# Patient Record
Sex: Male | Born: 1985 | Race: White | Hispanic: No | Marital: Single | Smoking: Former smoker
Health system: Southern US, Community
[De-identification: ages and names within clinical notes are randomized; demographics above are authoritative.]

## PROBLEM LIST (undated history)

## (undated) DIAGNOSIS — I471 Supraventricular tachycardia, unspecified: Secondary | ICD-10-CM

## (undated) HISTORY — DX: Supraventricular tachycardia, unspecified: I47.10

## (undated) HISTORY — DX: Supraventricular tachycardia: I47.1

---

## 2007-01-07 ENCOUNTER — Ambulatory Visit: Payer: Self-pay | Admitting: Internal Medicine

## 2007-01-08 ENCOUNTER — Ambulatory Visit (HOSPITAL_COMMUNITY): Admission: RE | Admit: 2007-01-08 | Discharge: 2007-01-08 | Payer: Self-pay | Admitting: Internal Medicine

## 2007-01-09 ENCOUNTER — Ambulatory Visit: Payer: Self-pay | Admitting: Cardiology

## 2007-03-26 ENCOUNTER — Ambulatory Visit: Payer: Self-pay | Admitting: Internal Medicine

## 2007-09-11 ENCOUNTER — Ambulatory Visit: Payer: Self-pay | Admitting: Internal Medicine

## 2008-03-31 ENCOUNTER — Ambulatory Visit: Payer: Self-pay | Admitting: Internal Medicine

## 2009-08-17 ENCOUNTER — Ambulatory Visit (HOSPITAL_COMMUNITY): Admission: EM | Admit: 2009-08-17 | Discharge: 2009-08-17 | Payer: Self-pay | Admitting: Emergency Medicine

## 2009-08-17 ENCOUNTER — Encounter (INDEPENDENT_AMBULATORY_CARE_PROVIDER_SITE_OTHER): Payer: Self-pay | Admitting: Surgery

## 2010-06-13 LAB — COMPREHENSIVE METABOLIC PANEL
ALT: 27 U/L (ref 0–53)
Albumin: 4.4 g/dL (ref 3.5–5.2)
Alkaline Phosphatase: 77 U/L (ref 39–117)
CO2: 26 mEq/L (ref 19–32)
Calcium: 9.4 mg/dL (ref 8.4–10.5)
GFR calc Af Amer: >60 mL/min (ref >60)
GFR calc non Af Amer: >60 mL/min (ref >60)
Total Bilirubin: 1.2 mg/dL (ref 0.3–1.2)

## 2010-06-13 LAB — CBC
HCT: 45.2 % (ref 39.0–52.0)
Hemoglobin: 15.8 g/dL (ref 13.0–17.0)
MCHC: 35 g/dL (ref 30.0–36.0)
Platelets: 138 10*3/uL — ABNORMAL LOW (ref 150–400)
RBC: 5.03 MIL/uL (ref 4.22–5.81)
WBC: 14 10*3/uL — ABNORMAL HIGH (ref 4.0–10.5)

## 2010-06-13 LAB — POCT CARDIAC MARKERS
CKMB, poc: 2.4 ng/mL (ref 1.0–8.0)
Myoglobin, poc: 117 ng/mL (ref 12–200)
Troponin i, poc: <0.05 ng/mL (ref 0.00–0.09)

## 2010-06-13 LAB — DIFFERENTIAL
Eosinophils Absolute: 0.3 10*3/uL (ref 0.0–0.7)
Lymphocytes Relative: 6 % — ABNORMAL LOW (ref 12–46)
Lymphs Abs: 0.9 10*3/uL (ref 0.7–4.0)
Neutrophils Relative %: 86 % — ABNORMAL HIGH (ref 43–77)

## 2010-06-13 LAB — URINALYSIS, ROUTINE W REFLEX MICROSCOPIC
Bilirubin Urine: NEGATIVE
Hgb urine dipstick: NEGATIVE
Ketones, ur: NEGATIVE mg/dL
Protein, ur: NEGATIVE mg/dL
Specific Gravity, Urine: 1.026 (ref 1.005–1.030)
Urobilinogen, UA: 0.2 mg/dL (ref 0.0–1.0)

## 2010-06-13 LAB — LIPASE, BLOOD: Lipase: 27 U/L (ref 11–59)

## 2010-08-09 NOTE — Assessment & Plan Note (Signed)
[facility]                         ELECTROPHYSIOLOGY OFFICE NOTE   NAME:Gary Salazar                 MRN:          [id number]  DATE:01/07/2007                            DOB:          [birth year]    I had the privilege of seeing Gary Salazar today at the family's  request because of supraventricular tachycardia.   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  Most  of these spells have been of relative short duration, less than 10-15  minutes.  He had an episode in June and another one in September, both  of which required him to call EMS (see below) and for which he received  adenosine which was quite symptomatic.   In any case, with his symptoms of tachycardia, he gets quite short of  breath.  He gets lightheaded.  He has a heaviness in his chest.  These  are fraud negative and diuretic negative.  In the past, he has tried  carotid massage and Valsalva to terminate these spells that has been  without effect.   He went on these occasions to [facility].  Electrocardiograms are described below.   He is aware of some change in his exercise capacity during the summer.  He thinks his heart is weaker but he is not quite sure whether this is  in fact real or whether this just represents an increased awareness of  his heartbeat and of occasional skips.   He has no other limiting symptoms.  He has no significant shortness of  breath, chest pain, and there has been no syncope.   FAMILY HISTORY:  Negative.   He has tried to cut down on his caffeine use and other stimulant use as  well as his alcohol use.   He takes no medications.   HE IS INTOLERANT OF ZITHROMAX.   SOCIAL HISTORY:  Is as noted previously, he does use alcohol and  occasional cigarettes.   PRIOR SURGERIES:  Hernia repair.   EXAMINATION:  He is a  healthy-appearing young Caucasian male looking his  age of 89.  His blood pressure is 118/70.  His heart rate was 57.  HEENT:  No icterus or xanthoma.  The neck veins were flat.  The carotids were brisk and full bilaterally  without bruits.  The back was without kyphosis, scoliosis.  LUNGS:  Clear.  Heart sounds were regular without murmurs, rubs, or gallops.  ABDOMEN:  Soft with active bowel sounds without midline pulsation, or  hepatomegaly.  Femoral pulses were 2+ distal.  Pulses were intact, and there is no  clubbing, cyanosis, or edema.  NEUROLOGIC:  Grossly normal.  SKIN:  Warm and dry.   Electrocardiogram was tachycardic done the 7th of September demonstrated  a narrow QRS tachycardia at a cycle length of 290 msec.  There was an R  prime in lead V1.  Sinus electrocardiogram dated that same day  demonstrated a rate of 104 with intervals of 0.17/0.9/0.30.  The axis  was 55 degrees, and no R prime was noted; however, on the  electrocardiogram dated today at a rate of 57, there is an R prime again  in lead V1.  There is also suggestion of a 2nd deflection in the  proximal portion of the ST segment and tachycardia.   IMPRESSION:  1. 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  2. Question heart weakness following these episodes above.   Gary Salazar and his mother and I had a lengthy discussion regarding the  physiology and the treatment options.  We discussed the potential role  of p.r.n. beta blockers or calcium blockers, daily therapy, and/or EP  study with catheter ablation.  We discussed the potential benefits as  well as the potential risks including but not limited to death,  perforation, heart block requiring pacemaker implantation.  They  understand these risks.   After these discussions, we have elected to proceed with a 2 D  echocardiogram and p.r.n. calcium blocker  therapy.   In addition, I have suggested that when he encounters EMS that he ask  them to use either intravenous diltiazem or intravenous verapamil given  these significant symptoms associated with his adenosine.   We will plan to see him again over Christmas vacation.     Duke Salvia, MD, [facility]  Electronically Signed    SCK/MedQ  DD: 01/07/2007  DT: 01/07/2007  Job #: [id number]   cc:   Donna Bernard, M.D.  [place] [address] [address], [phone], [address],

## 2010-08-09 NOTE — Assessment & Plan Note (Signed)
 [facility]ALTH[facility]                         ELECTROPHYSIOLOGY OFFICE NOTE   NAME:Salazar, Gary VALLEE                 MRN:          [id number]  DATE:03/31/2008                            DOB:          1985-09-08    Mr. Gary Salazar is seen in followup for SVT, probably AV reentry.  He has had  no recurrent palpitations.  He is still taking his weight lifting  supplements but without difficulty.   His medications include only the supplements.   PHYSICAL EXAMINATION:  VITAL SIGNS:  Today, his blood pressure is 104/64  and his weight was 222 which is down 10 pounds.  LUNGS:  Clear.  HEART:  Sounds were regular.  EXTREMITIES:  Without edema.   Electrocardiogram demonstrated sinus rhythm at 66 with __________.  The  axis was 75 degrees.  __________primarily in V1.   IMPRESSION:  1. Supraventricular tachycardia.  2. Regular sinus rhythm prime.   Mr. Gary Salazar is stable.  We will see him again in 1 year's time.     Duke Salvia, MD, [facility]  Electronically Signed    SCK/MedQ  DD: 03/31/2008  DT: 04/01/2008  Job #: [id number]   cc:   Donna Bernard, M.D.

## 2010-08-09 NOTE — Assessment & Plan Note (Signed)
[facility]                         ELECTROPHYSIOLOGY OFFICE NOTE   NAME:Dzikowski, ANIAS BARTOL                 MRN:          [id number]  DATE:03/26/2007                            DOB:          08/31/1985    Hunter Hohensee is back from school.  He has had no intercurrent episodes  of SVT.  He was given a prescription for verapamil which was the wrong  prescription and will give him a new one today.   Specifically he was given verapamil 240 to use p.r.n.   EXAMINATION:  On examination today his blood pressure is 129/69.  His  pulse was 66.  His weight was 227.  LUNGS:  Clear.  HEART:  Sounds were regular.   IMPRESSION:  1. Supraventricular tachycardia, probably AV re-entry.  2. Obesity.   Durene Cal is doing quite well.  Will plan to see him again in 6 month's  time and I have given a prescription for verapamil 40 mg to take as  needed.     Duke Salvia, MD, [facility]  Electronically Signed    SCK/MedQ  DD: 03/26/2007  DT: 03/26/2007  Job #: [id number]   cc:   Simone Curia, MD

## 2010-08-09 NOTE — Assessment & Plan Note (Signed)
[facility]                         ELECTROPHYSIOLOGY OFFICE NOTE   NAME:Wnek, DIEGO ULBRICHT                 MRN:          [id number]  DATE:09/11/2007                            DOB:          [birth year]    HISTORY:  Mr. Durene Cal is seen in followup for supraventricular  tachycardia, which is probably AV reentry.  He has had no recurrent  tachy palpitations.  He has had occasional isolated extra beats where he  feels like his heart is stopping.  This occurred in the context of  having broken up with his girlfriend of 4 years.   PHYSICAL EXAMINATION:  GENERAL:  He is in no acute distress.  VITAL SIGNS:  His blood pressure is 114/63.  LUNGS:  His lungs are clear.  HEART:  Heart sounds are regular.  EXTREMITIES:  Were without edema.   IMPRESSION:  1. Supraventricular tachycardia probably atrioventricular reentry.  2. Palpitations consistent with premature ventricle contractions.   PLAN:  Mr. Groeneveld is stable.  We will plan to see him again in 6 months'  time.     Duke Salvia, MD, [facility]  Electronically Signed    SCK/MedQ  DD: 09/11/2007  DT: 09/12/2007  Job #: [id number]   cc:   Donna Bernard, M.D.

## 2010-08-12 NOTE — Procedures (Signed)
NAME:  ROGERIO, BOUTELLE               ACCOUNT NO.:  [id number]   MEDICAL RECORD NO.:  [id number]          PATIENT TYPE:  OUT   LOCATION:  RAD                           FACILITY:  [facility]   PHYSICIAN:  Gerrit Friends. Dietrich Pates, MD, FACCDATE OF BIRTH:  1985/09/04   DATE OF PROCEDURE:  01/09/2007  DATE OF DISCHARGE:                                ECHOCARDIOGRAM   CLINICAL DATA:  A 25 year old gentleman with PSVT.   M-MODE:  Aorta 2.9, left atrium 4.07, LV diastole 4.2, LV systole 3.3.   FINDINGS:  1. Technically adequate echocardiographic study.  2. Normal left atrium, right atrium and right ventricle.  3. Normal and trileaflet aortic valve.  4. Normal ascending aorta and aortic arch.  5. Normal mitral, tricuspid and pulmonic valve; normal proximal      pulmonary artery; physiologic tricuspid regurgitation with normal      estimated RV systolic pressure.  6. Normal internal dimension, wall thickness, regional and global      function of the left ventricle.  7. Normal IVC.      Gerrit Friends. Dietrich Pates, MD, [facility]  Electronically Signed     RMR/MEDQ  D:  01/09/2007  T:  01/10/2007  Job:  [id number]

## 2010-08-26 HISTORY — PX: APPENDECTOMY: SHX54

## 2011-11-24 IMAGING — CT CT ABD-PELV W/ CM
3 of 4 series · 16 of 46 positions shown, 20 images · IV contrast (water & 100 ML OMNI 300)
Comparison: None.

CLINICAL DATA: Right flank and right lower quadrant pain.

CT ABDOMEN AND PELVIS WITH CONTRAST
TECHNIQUE: Multidetector CT imaging of the abdomen and pelvis was
performed following the standard protocol during bolus
administration of intravenous contrast.
Contrast: 100 ml Amnipaque-9II IV.

[Series 2: routine abdomen · axial · 0.84mm/px · z∈[-400,+0]mm · 12 of 92 slices shown, 16 images]
[im 8/92  soft-tissue]
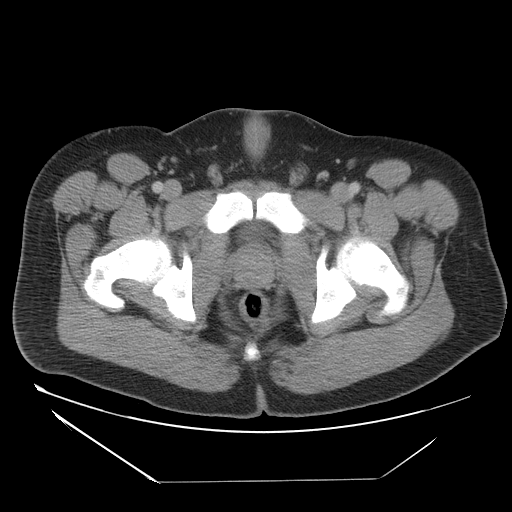
[im 8/92  bone]
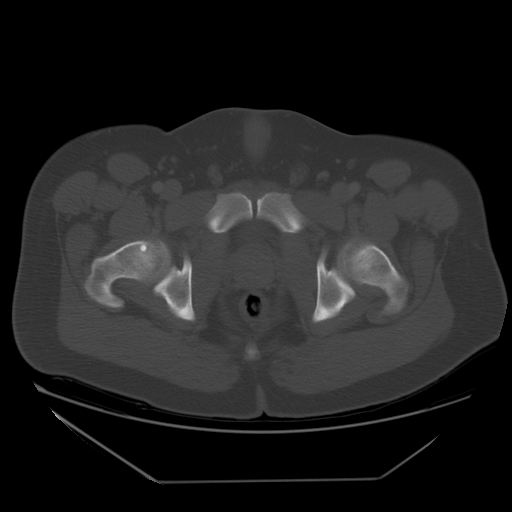
[im 15/92  soft-tissue]
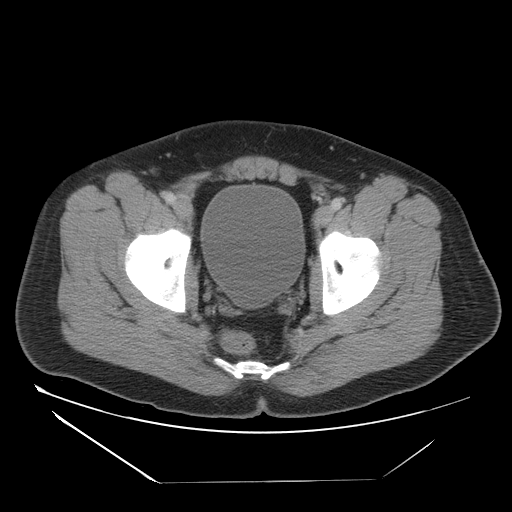
[im 26/92  soft-tissue]
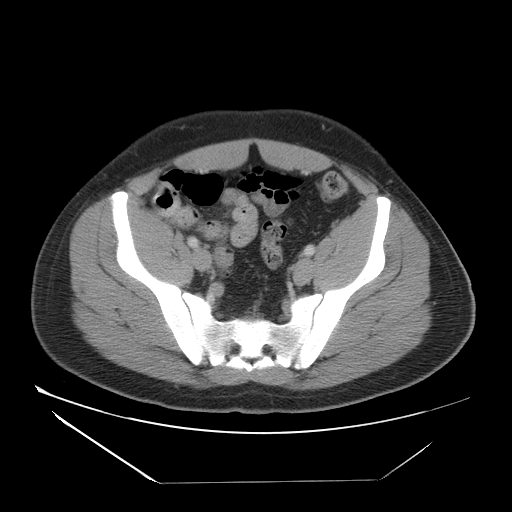
[im 33/92  soft-tissue]
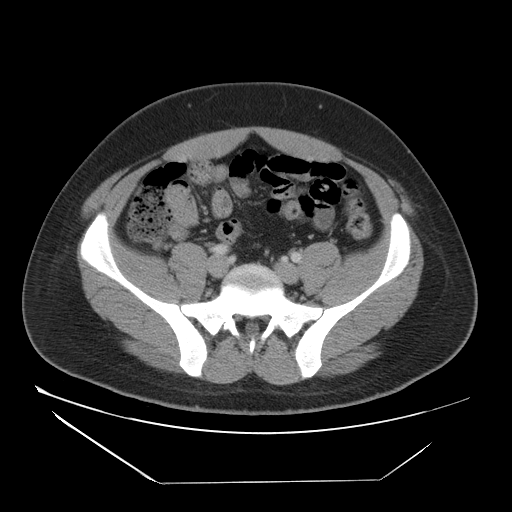
[im 41/92  soft-tissue]
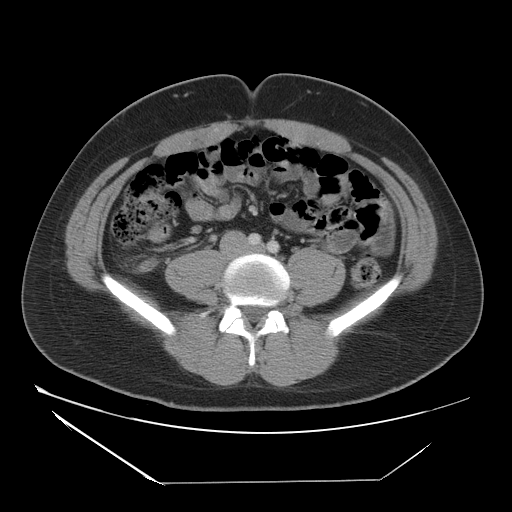
[im 51/92  soft-tissue]
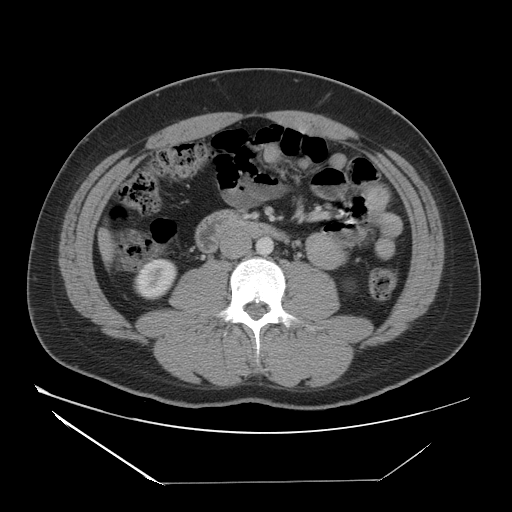
[im 59/92  soft-tissue]
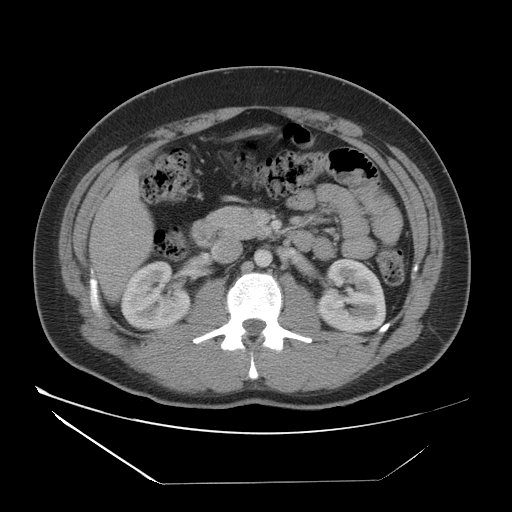
[im 70/92  soft-tissue]
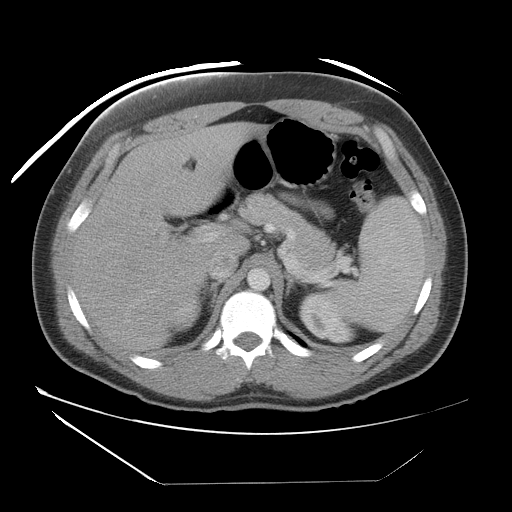
[im 77/92  soft-tissue]
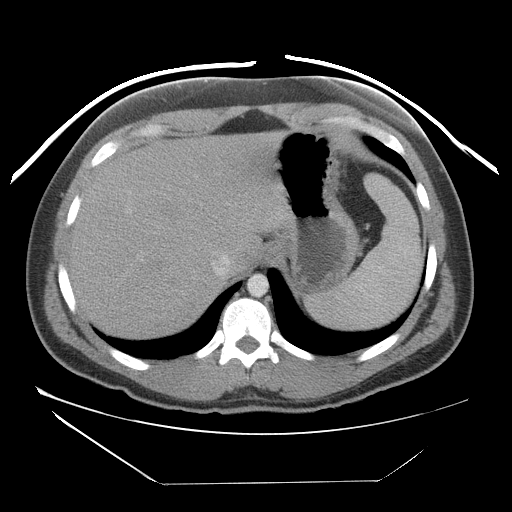
[im 77/92  lung]
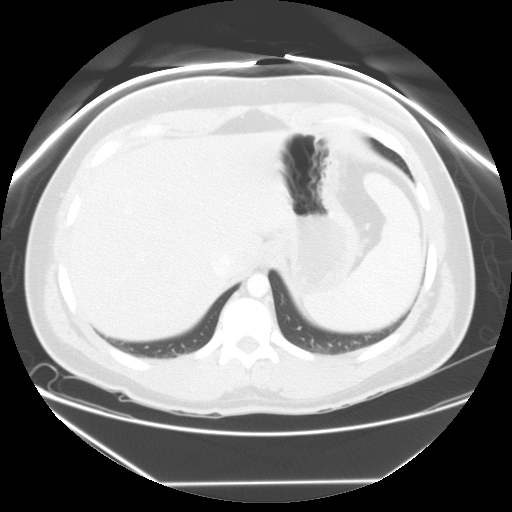
[im 77/92  bone]
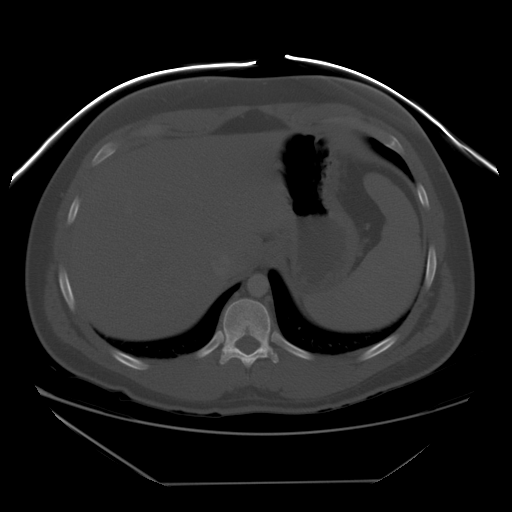
[im 81/92  lung]
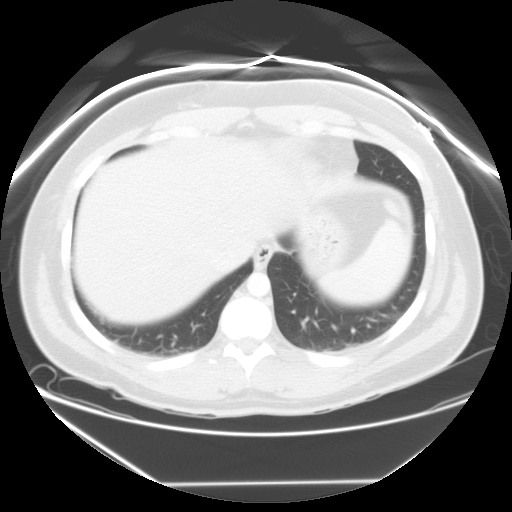
[im 84/92  soft-tissue]
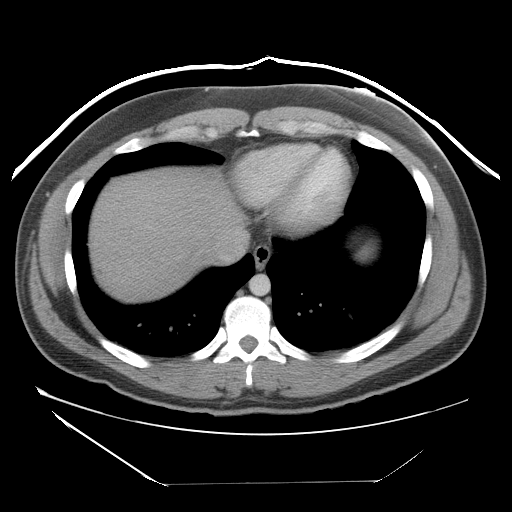
[im 84/92  lung]
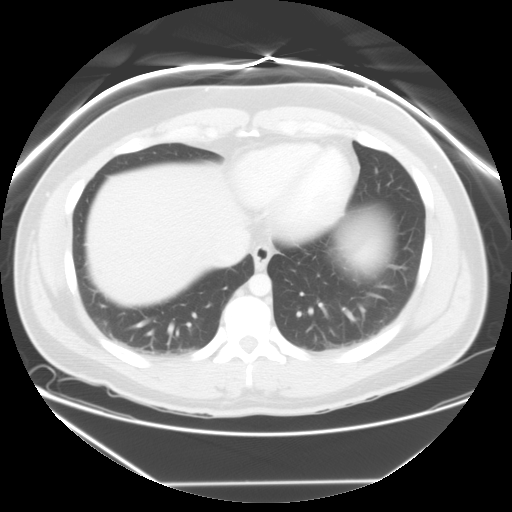
[im 88/92  lung]
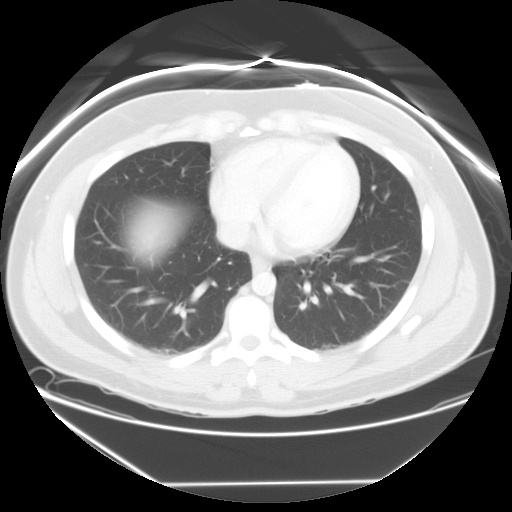

[Series 400: sagittal · sagittal · 0.90mm/px · 1 of 119 slices shown]
[im 40/119  soft-tissue]
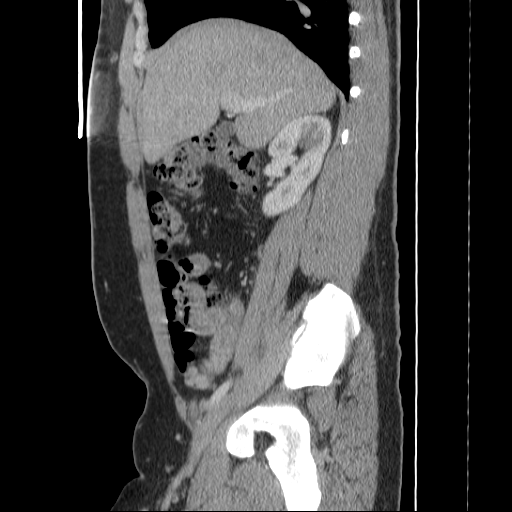

[Series 401: coronal · coronal · 0.90mm/px · 3 of 93 slices shown]
[im 31/93  soft-tissue]
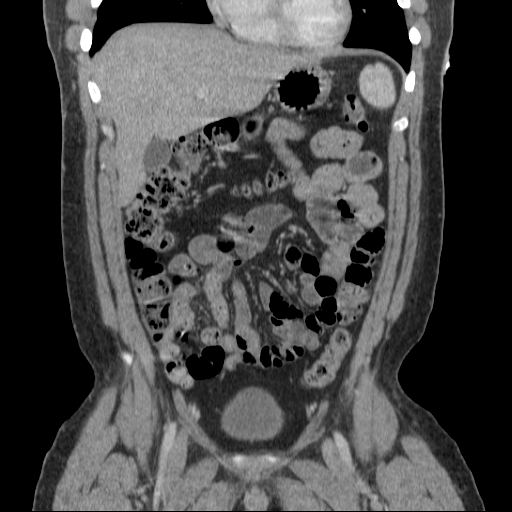
[im 41/93  soft-tissue]
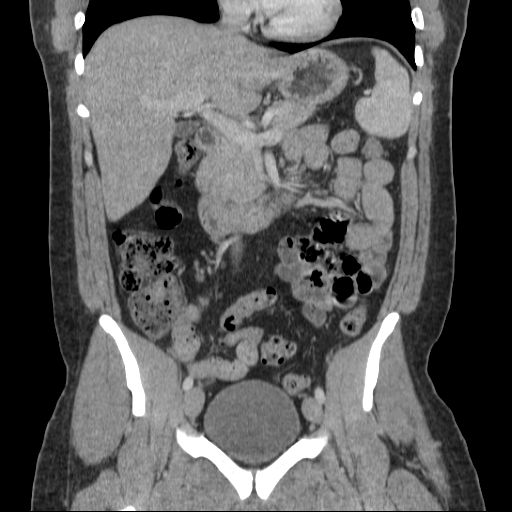
[im 52/93  soft-tissue]
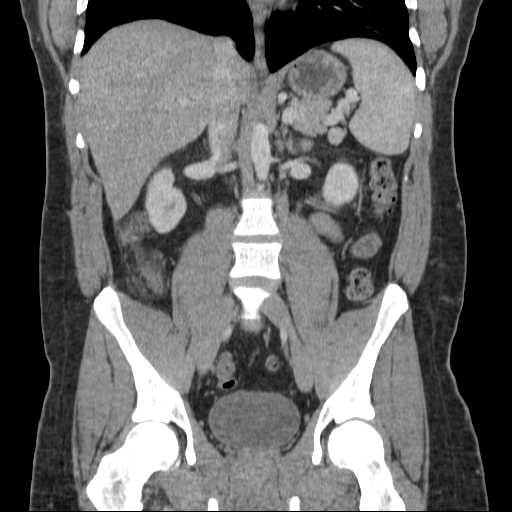

[16 of 46 positions shown; findings below may reference images not displayed]

FINDINGS: Normal liver, spleen, pancreas, kidneys, and adrenal
glands.  The appendix is dilated and there is periappendiceal
edema.  Findings compatible with acute appendicitis.  No abscess.
No findings to suggest perforation.
IMPRESSION: Findings consistent with acute appendicitis.

## 2012-08-14 ENCOUNTER — Ambulatory Visit (INDEPENDENT_AMBULATORY_CARE_PROVIDER_SITE_OTHER): Payer: 59 | Admitting: Family Medicine

## 2012-08-14 ENCOUNTER — Encounter: Payer: Self-pay | Admitting: Family Medicine

## 2012-08-14 VITALS — BP 110/72 | Temp 97.5°F | Wt 240.0 lb

## 2012-08-14 DIAGNOSIS — B349 Viral infection, unspecified: Secondary | ICD-10-CM

## 2012-08-14 DIAGNOSIS — J029 Acute pharyngitis, unspecified: Secondary | ICD-10-CM

## 2012-08-14 DIAGNOSIS — B9789 Other viral agents as the cause of diseases classified elsewhere: Secondary | ICD-10-CM

## 2012-08-14 LAB — POCT RAPID STREP A (OFFICE): Rapid Strep A Screen: NEGATIVE

## 2012-08-14 MED ORDER — CEFPROZIL 500 MG PO TABS: 500.0000 mg | ORAL_TABLET | Freq: Two times a day (BID) | ORAL | Status: DC

## 2012-08-14 NOTE — Progress Notes (Signed)
  Subjective:    Patient ID: Gary Salazar, male    DOB: 07-27-1985, 27 y.o.   MRN: [id number]  Sore Throat  This is a new problem. The current episode started in the past 7 days. Associated symptoms include abdominal pain, coughing, headaches and a hoarse voice. Associated symptoms comments: Body aches. He has tried NSAIDs for the symptoms. The treatment provided mild relief.   Lost voive Sat/ throat raw this am / body aches/ sweats/ no chills/ top of head aches.   Review of Systems  HENT: Positive for hoarse voice.   Respiratory: Positive for cough.   Gastrointestinal: Positive for abdominal pain.  Neurological: Positive for headaches.       Objective:   Physical Exam Throat minimal erythema eardrums are normal neck is supple sinus nontender lungs are clear hearts regular skin warm dry patient not toxic Rapid strep test was negative.       Assessment & Plan:  Pharyngitis-probable viral illness. I talked with him at length about his options we could go ahead and cover with an antibiotic but I told him it would be better if he gets this a couple days and see if it doesn't go away on its own I did give him a prescription for antibody he can get filled in 2 days if its causing him more trouble. Cefzil 500 twice a day for 10 days but at this point in time not to do that does hold onto the prescription he understood and agreed that he would like to avoid antibiotics. 15 minutes spent with patient.

## 2013-05-08 ENCOUNTER — Telehealth: Payer: Self-pay | Admitting: Family Medicine

## 2013-05-08 NOTE — Telephone Encounter (Signed)
Pt has PE appt 05/19/13, would like blood work orders, would like to get what his insurance covers, please call when done.  [phone]

## 2013-05-08 NOTE — Telephone Encounter (Signed)
No recent labs.

## 2013-05-19 ENCOUNTER — Encounter: Payer: Self-pay | Admitting: Family Medicine

## 2013-05-19 ENCOUNTER — Ambulatory Visit (INDEPENDENT_AMBULATORY_CARE_PROVIDER_SITE_OTHER): Payer: 59 | Admitting: Family Medicine

## 2013-05-19 VITALS — BP 118/72 | HR 70 | Ht 70.0 in | Wt 245.0 lb

## 2013-05-19 DIAGNOSIS — Z0000 Encounter for general adult medical examination without abnormal findings: Secondary | ICD-10-CM

## 2013-05-19 DIAGNOSIS — E785 Hyperlipidemia, unspecified: Secondary | ICD-10-CM

## 2013-05-19 LAB — HEPATIC FUNCTION PANEL
ALT: 27 U/L (ref 0–53)
AST: 25 U/L (ref 0–37)
Albumin: 4.8 g/dL (ref 3.5–5.2)
Alkaline Phosphatase: 55 U/L (ref 39–117)
BILIRUBIN DIRECT: 0.1 mg/dL (ref 0.0–0.3)
BILIRUBIN INDIRECT: 0.6 mg/dL (ref 0.2–1.2)
TOTAL PROTEIN: 6.9 g/dL (ref 6.0–8.3)
Total Bilirubin: 0.7 mg/dL (ref 0.2–1.2)

## 2013-05-19 LAB — LIPID PANEL
Cholesterol: 228 mg/dL — ABNORMAL HIGH (ref 0–200)
HDL: 49 mg/dL (ref >39)
LDL CALC: 147 mg/dL — AB (ref 0–99)
Total CHOL/HDL Ratio: 4.7 Ratio
Triglycerides: 162 mg/dL — ABNORMAL HIGH (ref <150)
VLDL: 32 mg/dL (ref 0–40)

## 2013-05-19 LAB — BASIC METABOLIC PANEL
BUN: 13 mg/dL (ref 6–23)
CO2: 31 mEq/L (ref 19–32)
CREATININE: 0.92 mg/dL (ref 0.50–1.35)
Calcium: 9.7 mg/dL (ref 8.4–10.5)
Chloride: 101 mEq/L (ref 96–112)
GLUCOSE: 84 mg/dL (ref 70–99)
POTASSIUM: 4.7 meq/L (ref 3.5–5.3)
SODIUM: 140 meq/L (ref 135–145)

## 2013-05-19 NOTE — Progress Notes (Signed)
   Subjective:    Patient ID: Gary Salazar, male    DOB: 08-21-1985, 28 y.o.   MRN: [id number]  HPIPhysical. Patient reports no problems or concerns at this time.   Exercise has fell off  Diet overall 8 out of 10  Eating more veggies  Rarely eats fast food  Does not drink soft drinks   Rare sugar intake  Rare drink  Watching glu  Dips daily, Parks wherever  No colon ca no prostate ca  Eating better  No hx of bp problems  Wears seat belt  Aspirin prn rare Results for orders placed in visit on 08/14/12  STREP A DNA PROBE      Result Value Ref Range   GASP NEGATIVE    POCT RAPID STREP A (OFFICE)      Result Value Ref Range   Rapid Strep A Screen Negative  Negative      Review of Systems  Constitutional: Negative for fever, activity change and appetite change.  HENT: Negative for congestion and rhinorrhea.   Eyes: Negative for discharge.  Respiratory: Negative for cough and wheezing.   Cardiovascular: Negative for chest pain.  Gastrointestinal: Negative for vomiting, abdominal pain and blood in stool.  Genitourinary: Negative for frequency and difficulty urinating.  Musculoskeletal: Negative for neck pain.  Skin: Negative for rash.  Allergic/Immunologic: Negative for environmental allergies and food allergies.  Neurological: Negative for weakness and headaches.  Psychiatric/Behavioral: Negative for agitation.       Objective:   Physical Exam  Vitals reviewed. Constitutional: He appears well-developed and well-nourished.  HENT:  Head: Normocephalic and atraumatic.  Right Ear: External ear normal.  Left Ear: External ear normal.  Nose: Nose normal.  Mouth/Throat: Oropharynx is clear and moist.  Eyes: EOM are normal. Pupils are equal, round, and reactive to light.  Neck: Normal range of motion. Neck supple. No thyromegaly present.  Cardiovascular: Normal rate, regular rhythm and normal heart sounds.   No murmur heard. Pulmonary/Chest: Effort  normal and breath sounds normal. No respiratory distress. He has no wheezes.  Abdominal: Soft. Bowel sounds are normal. He exhibits no distension and no mass. There is no tenderness.  Genitourinary: Penis normal.  Musculoskeletal: Normal range of motion. He exhibits no edema.  Lymphadenopathy:    He has no cervical adenopathy.  Neurological: He is alert. He exhibits normal muscle tone.  Skin: Skin is warm and dry. No erythema.  Psychiatric: He has a normal mood and affect. His behavior is normal. Judgment normal.          Assessment & Plan:  Impression wellness exam. #2 chronic tobacco use. Smokeless tobacco discussed plan exercise discussed. Appropriate blood work. Tried exercise year-round. Diet discussed. Patient thinks up-to-date on tetanus shot. WSL

## 2013-05-25 ENCOUNTER — Encounter: Payer: Self-pay | Admitting: Family Medicine

## 2016-06-29 ENCOUNTER — Encounter: Payer: Self-pay | Admitting: Family Medicine

## 2016-06-29 ENCOUNTER — Ambulatory Visit (INDEPENDENT_AMBULATORY_CARE_PROVIDER_SITE_OTHER): Payer: Managed Care, Other (non HMO) | Admitting: Family Medicine

## 2016-06-29 VITALS — BP 130/82 | Ht 70.0 in | Wt 273.2 lb

## 2016-06-29 DIAGNOSIS — Z0000 Encounter for general adult medical examination without abnormal findings: Secondary | ICD-10-CM | POA: Diagnosis not present

## 2016-06-29 DIAGNOSIS — I471 Supraventricular tachycardia, unspecified: Secondary | ICD-10-CM

## 2016-06-29 DIAGNOSIS — Z131 Encounter for screening for diabetes mellitus: Secondary | ICD-10-CM

## 2016-06-29 DIAGNOSIS — Z79899 Other long term (current) drug therapy: Secondary | ICD-10-CM

## 2016-06-29 DIAGNOSIS — Z13220 Encounter for screening for lipoid disorders: Secondary | ICD-10-CM

## 2016-06-29 NOTE — Progress Notes (Signed)
   Subjective:    Patient ID: Gary Salazar, male    DOB: 04/18/1985, 31 y.o.   MRN: [id number]  HPI The patient comes in today for a wellness visit.    A review of their health history was completed.  A review of medications was also completed.  Any needed refills; no  Eating habits: eating pretty healthy  Falls/  MVA accidents in past few months: no  Regular exercise: just started back, too busy with work. Recently a month ago has started back    Specialist pt sees on regular basis: no  Preventative health issues were discussed.   Additional concerns: Request referral to cardiology for his PSVT condition. Had rapid heart rate, felt a bit uncomfortable with it.Marland Kitchen occas cafeie, [employer] coffee,   Engineering working at [employer]  Patient continues to take lipid medication regularly. No obvious side effects from it. Generally does not miss a dose. Prior blood work results are reviewed with patient. Patient continues to work on fat intake in diet  Needs to do blood work outside of [employer], off and on  occas alcohol, both beer and liquor   Dr Kathrynn Ducking the cardiologist who care for him in the past  Patient notes gradual weight gain over the last couple years. Not exercising much. Not watching diet very well.                    Review of Systems  Constitutional: Negative for activity change, appetite change and fever.  HENT: Negative for congestion and rhinorrhea.   Eyes: Negative for discharge.  Respiratory: Negative for cough and wheezing.   Cardiovascular: Negative for chest pain.  Gastrointestinal: Negative for abdominal pain, blood in stool and vomiting.  Genitourinary: Negative for difficulty urinating and frequency.  Musculoskeletal: Negative for neck pain.  Skin: Negative for rash.  Allergic/Immunologic: Negative for environmental allergies and food allergies.  Neurological: Negative for weakness and headaches.    Psychiatric/Behavioral: Negative for agitation.  All other systems reviewed and are negative.      Objective:   Physical Exam  Constitutional: He appears well-developed and well-nourished.  Obesity present  HENT:  Head: Normocephalic and atraumatic.  Right Ear: External ear normal.  Left Ear: External ear normal.  Nose: Nose normal.  Mouth/Throat: Oropharynx is clear and moist.  Eyes: EOM are normal. Pupils are equal, round, and reactive to light.  Neck: Normal range of motion. Neck supple. No thyromegaly present.  Cardiovascular: Normal rate, regular rhythm and normal heart sounds.   No murmur heard. Pulmonary/Chest: Effort normal and breath sounds normal. No respiratory distress. He has no wheezes.  Abdominal: Soft. Bowel sounds are normal. He exhibits no distension and no mass. There is no tenderness.  Genitourinary: Penis normal.  Musculoskeletal: Normal range of motion. He exhibits no edema.  Lymphadenopathy:    He has no cervical adenopathy.  Neurological: He is alert. He exhibits normal muscle tone.  Skin: Skin is warm and dry. No erythema.  Psychiatric: He has a normal mood and affect. His behavior is normal. Judgment normal.  Vitals reviewed.         Assessment & Plan:  Impression wellness exam. Diet discussed exercise discussed anticipatory guidance given #2 obesity discussed #3 history of hyperlipidemia status uncertain #4 history of PSVT with recent emergence patient request cardiology referral which I think is appropriate plan appropriate blood work. Diet exercise discussed. Cardiology referral

## 2016-07-06 ENCOUNTER — Ambulatory Visit (INDEPENDENT_AMBULATORY_CARE_PROVIDER_SITE_OTHER): Payer: Managed Care, Other (non HMO) | Admitting: Cardiology

## 2016-07-06 ENCOUNTER — Encounter: Payer: Self-pay | Admitting: Cardiology

## 2016-07-06 VITALS — BP 118/78 | HR 71 | Ht 70.0 in | Wt 280.2 lb

## 2016-07-06 DIAGNOSIS — I471 Supraventricular tachycardia: Secondary | ICD-10-CM | POA: Diagnosis not present

## 2016-07-06 NOTE — Progress Notes (Signed)
PCP: Gary South, MD  Clinic Note: Chief Complaint  Patient presents with  . New Patient (Initial Visit)    pt states he doesn't know if it chest or shoulder pain     HPI: Gary Salazar is a 31 y.o. male with a PMH below who presents today for establishment of cardiology care - has previous Dx of PSVT.Marland Kitchen Gary Salazar presents today to discuss recurrence of his SVT. He previously saw Gary Salazar roughly 10 years ago his 71s and he is having relatively frequent episodes. However since age 24 he really had not had any further episodes. He had been very active and started exercising and everything. He noted this time the alcohol and dehydration triggered his episodes.  Recent Hospitalizations: None  Studies Reviewed: None  Interval History: Gary Salazar presents today describing having a recurrent episode of dizziness and fatigue with palpitations that lasted maybe 10 minutes where he will feel his heart rate up. This episode was associated with having started an exercise program when he was doing quite well but the night before he may have had little alcohol and was loaded dehydrated and therefore should he develop intermittent runs of weight he felt like his previous SVT. No witnesses or witnessing data is available to determine what it is.  He felt a little lightheaded and dizzy with it but no syncope or near syncope. No TIA or amaurosis fugax.  No chest pain or shortness of breath with rest or exertion -- he just has an abnormal feeling in his chest when he has the palpitations. No chest pain. No PND, orthopnea or edema.  No TIA/amaurosis fugax symptoms. No melena, hematochezia, hematuria, or epstaxis. No claudication.  ROS: A comprehensive was performed. Review of Systems  Constitutional: Negative for malaise/fatigue.  HENT: Negative for congestion and nosebleeds.   Respiratory: Negative for cough and shortness of breath.   Cardiovascular: Positive for palpitations.  Gastrointestinal:  Negative for blood in stool and melena.  Genitourinary: Negative for flank pain and hematuria.  Musculoskeletal: Negative for neck pain.  Neurological: Positive for dizziness.  Psychiatric/Behavioral: Negative for depression. The patient is nervous/anxious.     Past Medical History:  Diagnosis Date  . PSVT (paroxysmal supraventricular tachycardia) (HCC)    Gary Salazar Dx'd ~2018, had been relatively stable in >  1 yr.    Past Surgical History:  Procedure Laterality Date  . APPENDECTOMY  08/2010    Current Meds  Medication Sig  . Multiple Vitamin (MULTIVITAMIN) tablet Take 1 tablet by mouth daily.  . vitamin C (ASCORBIC ACID) 500 MG tablet Take 500 mg by mouth daily. Takes 3,000mg  per day    Allergies  Allergen Reactions  . Azithromycin     Social History   Social History  . Marital status: Single    Spouse name: N/A  . Number of children: N/A  . Years of education: N/A   Social History Main Topics  . Smoking status: Former Games developer  . Smokeless tobacco: Former Neurosurgeon    Quit date: 05/19/2006     Comment: former light smoker  . Alcohol use None  . Drug use: No  . Sexual activity: No   Other Topics Concern  . None   Social History Narrative   He is single. Lives alone. 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   He drinks maybe 6 beers a week. He does note that he quit smoking cigars. He does cardio and weights 3 days a week for anywhere from 60-80  minutes.    family history is not on file.  Wt Readings from Last 3 Encounters:  07/06/16 280 lb 3.2 oz (127.1 kg)  06/29/16 273 lb 3.2 oz (123.9 kg)  05/19/13 245 lb (111.1 kg)    PHYSICAL EXAM BP 118/78   Pulse 71   Ht  (1.778 m)   Wt 280 lb 3.2 oz (127.1 kg)   BMI 40.20 kg/m  General appearance: alert, cooperative, appears stated age, no distress and Severely obese. Otherwise well-nourished well-groomed. Pleasant mood and affect. Neck: no adenopathy, no carotid bruit and no JVD Lungs: clear to  auscultation bilaterally, normal percussion bilaterally and non-labored Heart: regular rate and rhythm, S1, S2 normal, no murmur, click, rub or gallop; unable to palpate PMI. Abdomen: soft, non-tender; bowel sounds normal; no masses,  no organomegaly;  Extremities: extremities normal, atraumatic, no cyanosis, and edema - trace pedal Pulses: 2+ and symmetric;  Skin: mobility and turgor normal, no edema, no evidence of bleeding or bruising and no lesions noted  Neurologic: Mental status: Alert, oriented, thought content appropriate Cranial nerves: normal (II-XII grossly intact)    Adult ECG Report  Rate: 71 ;  Rhythm: normal sinus rhythm and inc RBBB;   Narrative Interpretation: relatively normal EKG   Other studies Reviewed: Additional studies/ records that were reviewed today include:  Recent Labs:  n/a     ASSESSMENT / PLAN: Problem List Items Addressed This Visit    PSVT (paroxysmal supraventricular tachycardia) (HCC) - recurrent - Primary (Chronic)    Very interesting there is been about a year since his last episode. He does admit that he had eloped of alcohol the night before and was probably little dehydrated. This in the past been more the triggers for symptoms. He did not have any syncope or near-syncope. Going lasted 10 minutes. He tried coughing which used to work for him, but it didn't work initially. The symptoms basically resolved after 10 minutes and he did only notes feeling lightheaded. At this point I do not think that we need to wear a monitor because it could be another year before he has another episode.  We discussed different vagal maneuvers including squatting and cold water. I think at this point the best plan is to discuss triggers and urgent treatment plans. We went through different vagal maneuvers including carotid massage and emergency room based adenosine which is felt before. Otherwise asymptomatic and very active at his age.  If episodes were to recur, I  would probably go back to see Gary Salazar to consider the possibility of ablation      Relevant Orders   EKG 12-Lead (Completed)      Current medicines are reviewed at length with the patient today. (+/- concerns) n/a The following changes have been made: n/a  Patient Instructions  As discussed  For rapid heartrate ( psvt)  vagal manuevers -coughing - bearing down- -drinking cold water -putting ice cold towel to your face Your physician wants you to follow-up in 12 months with DR HARDING.You will receive a reminder letter in the mail two months in advance. If you don't receive a letter, please call our office to schedule the follow-up appointment.       Supraventricular Tachycardia, Adult Supraventricular tachycardia (SVT) is a kind of abnormal heartbeat. It makes your heart beat very fast and then beat at a normal speed. A normal heart beats 60-100 times a minute. This condition can make your heart beat more than 150 times a minute. Times of  having a fast heartbeat (episodes) can be scary, but they are usually not dangerous. They can lead to problems if:  They happen often.  They last a long time. Symptoms of this condition include:  A pounding heart.  A feeling that your heart is skipping beats (palpitations).  Weakness.  Trouble getting enough air (shortness of breath).  Pain or tightness in your chest.  Feeling like you are going to pass out (light-headedness).  Feeling worried or nervous (anxiety).  Dizziness.  Sweating.  Feeling sick to your stomach (nausea).  Passing out (fainting).  Tiredness. Sometimes, there are no symptoms. Follow these instructions at home: Stress   Avoid things that make you feel stressed.  Find out what helps you feel less stressed. Try:  Doing a relaxing activity, like yoga, meditation, or being out in nature.  Listening to relaxing music.  Doing relaxation techniques, like deep breathing.  Taking steps to be  healthy. These include getting lots of sleep, exercising, and eating a balanced diet.  Talking with a mental health doctor. Sleep   Try to get at least 7 hours of sleep each night. Tobacco and nicotine   Do not use anything that has nicotine or tobacco, such as cigarettes and e-cigarettes. If you need help quitting, ask your doctor. Alcohol   If alcohol gives you a fast heartbeat, do not drink alcohol.  If alcohol does not seem to give you a fast heartbeat, limit your alcohol. For nonpregnant women, this means no more than 1 drink a day. For men, this means no more than 2 drinks a day. "One drink" means one of these:  12 oz of beer.  5 oz of wine.  1 oz of hard liquor. Caffeine   If caffeine gives you a fast heartbeat, do not eat, drink, or use anything with caffeine in it.  If caffeine does not seem to give you a fast heartbeat, limit how much caffeine you eat, drink, or use. Stimulant drugs   Do not use stimulant drugs. These are drugs like cocaine or methamphetamine. If you need help quitting, ask your doctor. General instructions   Stay at a healthy weight.  Exercise regularly. Ask your doctor to suggest some good activities for you. Try one of these options:  150 minutes a week of gentle exercise, like walking or yoga.  75 minutes a week of exercise that is very active, like running or swimming.  A combination of gentle exercise and very active exercise.  Do home treatments to slow down your heartbeat as told by your doctor.  Take over-the-counter and prescription medicines only as told by your doctor. Contact a doctor if:  You have a fast heartbeat more often.  Times of having a fast heartbeat last longer than before.  Your home treatments to slow down your heartbeat do not help.  You have new symptoms. Get help right away if:  You have chest pain.  Your symptoms get worse.  You have trouble breathing.  Your heart beats very fast for more than 20  minutes.  You pass out (faint). These symptoms may be an emergency. Do not wait to see if the symptoms will go away. Get medical help right away. Call your local emergency services (911 in the [place] Do not drive yourself to the hospital. This information is not intended to replace advice given to you by your health care provider. Make sure you discuss any questions you have with your health care provider. Document Released: 03/13/2005 Document Revised:  11/18/2015 Document Reviewed: 11/18/2015 Elsevier Interactive Patient Education  2017 [employer].    Studies Ordered:   Orders Placed This Encounter  Procedures  . EKG 12-Lead      Bryan Lemma, M.D., M.S. Interventional Cardiologist   Pager # [phone] Phone # [phone] [address] [place], [place] [address]

## 2016-07-06 NOTE — Patient Instructions (Addendum)
As discussed  For rapid heartrate ( psvt)  vagal manuevers -coughing - bearing down- -drinking cold water -putting ice cold towel to your face Your physician wants you to follow-up in 12 months with DR HARDING.You will receive a reminder letter in the mail two months in advance. If you don't receive a letter, please call our office to schedule the follow-up appointment.       Supraventricular Tachycardia, Adult Supraventricular tachycardia (SVT) is a kind of abnormal heartbeat. It makes your heart beat very fast and then beat at a normal speed. A normal heart beats 60-100 times a minute. This condition can make your heart beat more than 150 times a minute. Times of having a fast heartbeat (episodes) can be scary, but they are usually not dangerous. They can lead to problems if:  They happen often.  They last a long time. Symptoms of this condition include:  A pounding heart.  A feeling that your heart is skipping beats (palpitations).  Weakness.  Trouble getting enough air (shortness of breath).  Pain or tightness in your chest.  Feeling like you are going to pass out (light-headedness).  Feeling worried or nervous (anxiety).  Dizziness.  Sweating.  Feeling sick to your stomach (nausea).  Passing out (fainting).  Tiredness. Sometimes, there are no symptoms. Follow these instructions at home: Stress   Avoid things that make you feel stressed.  Find out what helps you feel less stressed. Try:  Doing a relaxing activity, like yoga, meditation, or being out in nature.  Listening to relaxing music.  Doing relaxation techniques, like deep breathing.  Taking steps to be healthy. These include getting lots of sleep, exercising, and eating a balanced diet.  Talking with a mental health doctor. Sleep   Try to get at least 7 hours of sleep each night. Tobacco and nicotine   Do not use anything that has nicotine or tobacco, such as cigarettes and e-cigarettes. If  you need help quitting, ask your doctor. Alcohol   If alcohol gives you a fast heartbeat, do not drink alcohol.  If alcohol does not seem to give you a fast heartbeat, limit your alcohol. For nonpregnant women, this means no more than 1 drink a day. For men, this means no more than 2 drinks a day. "One drink" means one of these:  12 oz of beer.  5 oz of wine.  1 oz of hard liquor. Caffeine   If caffeine gives you a fast heartbeat, do not eat, drink, or use anything with caffeine in it.  If caffeine does not seem to give you a fast heartbeat, limit how much caffeine you eat, drink, or use. Stimulant drugs   Do not use stimulant drugs. These are drugs like cocaine or methamphetamine. If you need help quitting, ask your doctor. General instructions   Stay at a healthy weight.  Exercise regularly. Ask your doctor to suggest some good activities for you. Try one of these options:  150 minutes a week of gentle exercise, like walking or yoga.  75 minutes a week of exercise that is very active, like running or swimming.  A combination of gentle exercise and very active exercise.  Do home treatments to slow down your heartbeat as told by your doctor.  Take over-the-counter and prescription medicines only as told by your doctor. Contact a doctor if:  You have a fast heartbeat more often.  Times of having a fast heartbeat last longer than before.  Your home treatments to slow down  your heartbeat do not help.  You have new symptoms. Get help right away if:  You have chest pain.  Your symptoms get worse.  You have trouble breathing.  Your heart beats very fast for more than 20 minutes.  You pass out (faint). These symptoms may be an emergency. Do not wait to see if the symptoms will go away. Get medical help right away. Call your local emergency services (911 in the [place] Do not drive yourself to the hospital. This information is not intended to replace advice given to  you by your health care provider. Make sure you discuss any questions you have with your health care provider. Document Released: 03/13/2005 Document Revised: 11/18/2015 Document Reviewed: 11/18/2015 Elsevier Interactive Patient Education  2017 [employer].

## 2016-07-08 ENCOUNTER — Encounter: Payer: Self-pay | Admitting: Cardiology

## 2016-07-08 DIAGNOSIS — I471 Supraventricular tachycardia: Secondary | ICD-10-CM | POA: Insufficient documentation

## 2016-07-08 NOTE — Assessment & Plan Note (Addendum)
Very interesting there is been about a year since his last episode. He does admit that he had eloped of alcohol the night before and was probably little dehydrated. This in the past been more the triggers for symptoms. He did not have any syncope or near-syncope. Going lasted 10 minutes. He tried coughing which used to work for him, but it didn't work initially. The symptoms basically resolved after 10 minutes and he did only notes feeling lightheaded. At this point I do not think that we need to wear a monitor because it could be another year before he has another episode.  We discussed different vagal maneuvers including squatting and cold water. I think at this point the best plan is to discuss triggers and urgent treatment plans. We went through different vagal maneuvers including carotid massage and emergency room based adenosine which is felt before. Otherwise asymptomatic and very active at his age.  If episodes were to recur, I would probably go back to see Dr. Graciela Husbands to consider the possibility of ablation

## 2017-08-02 ENCOUNTER — Encounter: Payer: Self-pay | Admitting: Family Medicine

## 2017-08-02 ENCOUNTER — Ambulatory Visit (INDEPENDENT_AMBULATORY_CARE_PROVIDER_SITE_OTHER): Payer: Managed Care, Other (non HMO) | Admitting: Family Medicine

## 2017-08-02 VITALS — BP 114/72 | Ht 71.5 in | Wt 249.0 lb

## 2017-08-02 DIAGNOSIS — E7800 Pure hypercholesterolemia, unspecified: Secondary | ICD-10-CM

## 2017-08-02 DIAGNOSIS — Z0000 Encounter for general adult medical examination without abnormal findings: Secondary | ICD-10-CM

## 2017-08-02 NOTE — Progress Notes (Signed)
   Subjective:    Patient ID: Gary Salazar, male    DOB: [birth year], 32 y.o.   MRN: [id number]  HPI The patient comes in today for a wellness visit.    A review of their health history was completed.  A review of medications was also completed.  Any needed refills; no taking any meds  Eating habits: health conscious  Falls/  MVA accidents in past few months: none  Regular exercise: walking and work  Specialist pt sees on regular basis: none  Preventative health issues were discussed.   Additional concerns: none  Exercise going well up until recently   diet overall good, doing well, watching closely     Doing well    No more further psvt's episodes    Review of Systems  Constitutional: Negative for activity change, appetite change and fever.  HENT: Negative for congestion and rhinorrhea.   Eyes: Negative for discharge.  Respiratory: Negative for cough and wheezing.   Cardiovascular: Negative for chest pain.  Gastrointestinal: Negative for abdominal pain, blood in stool and vomiting.  Genitourinary: Negative for difficulty urinating and frequency.  Musculoskeletal: Negative for neck pain.  Skin: Negative for rash.  Allergic/Immunologic: Negative for environmental allergies and food allergies.  Neurological: Negative for weakness and headaches.  Psychiatric/Behavioral: Negative for agitation.  All other systems reviewed and are negative.      Objective:   Physical Exam  Constitutional: He appears well-developed and well-nourished.  HENT:  Head: Normocephalic and atraumatic.  Right Ear: External ear normal.  Left Ear: External ear normal.  Nose: Nose normal.  Mouth/Throat: Oropharynx is clear and moist.  Eyes: Pupils are equal, round, and reactive to light. EOM are normal.  Neck: Normal range of motion. Neck supple. No thyromegaly present.  Cardiovascular: Normal rate, regular rhythm and normal heart sounds.  No murmur heard. Pulmonary/Chest: Effort  normal and breath sounds normal. No respiratory distress. He has no wheezes.  Abdominal: Soft. Bowel sounds are normal. He exhibits no distension and no mass. There is no tenderness.  Genitourinary: Penis normal.  Musculoskeletal: Normal range of motion. He exhibits no edema.  Lymphadenopathy:    He has no cervical adenopathy.  Neurological: He is alert. He exhibits normal muscle tone.  Skin: Skin is warm and dry. No erythema.  Psychiatric: He has a normal mood and affect. His behavior is normal. Judgment normal.  Vitals reviewed.         Assessment & Plan:  1 impression wellness exam.  General concerns discussed.  Diet discussed.  Exercise discussed.  Prior elevated LDL also discussed, if this blood work not improved will be recommending medicine discussed with patient.  History of PSVT currently stable appropriate blood work further recommendations based on results

## 2017-08-03 LAB — HEPATIC FUNCTION PANEL
ALK PHOS: 74 IU/L (ref 39–117)
ALT: 23 IU/L (ref 0–44)
AST: 26 IU/L (ref 0–40)
Albumin: 4.7 g/dL (ref 3.5–5.5)
BILIRUBIN TOTAL: 0.5 mg/dL (ref 0.0–1.2)
Bilirubin, Direct: 0.08 mg/dL (ref 0.00–0.40)
TOTAL PROTEIN: 7.1 g/dL (ref 6.0–8.5)

## 2017-08-03 LAB — LIPID PANEL
Chol/HDL Ratio: 6.3 ratio — ABNORMAL HIGH (ref 0.0–5.0)
Cholesterol, Total: 241 mg/dL — ABNORMAL HIGH (ref 100–199)
HDL: 38 mg/dL — ABNORMAL LOW (ref >39)
LDL Calculated: 178 mg/dL — ABNORMAL HIGH (ref 0–99)
Triglycerides: 123 mg/dL (ref 0–149)
VLDL CHOLESTEROL CAL: 25 mg/dL (ref 5–40)

## 2017-08-03 LAB — BASIC METABOLIC PANEL
BUN/Creatinine Ratio: 10 (ref 9–20)
BUN: 11 mg/dL (ref 6–20)
CALCIUM: 9.9 mg/dL (ref 8.7–10.2)
CO2: 23 mmol/L (ref 20–29)
Chloride: 101 mmol/L (ref 96–106)
Creatinine, Ser: 1.05 mg/dL (ref 0.76–1.27)
GFR, EST AFRICAN AMERICAN: 109 mL/min/{1.73_m2} (ref >59)
GFR, EST NON AFRICAN AMERICAN: 94 mL/min/{1.73_m2} (ref >59)
Glucose: 91 mg/dL (ref 65–99)
Potassium: 5.4 mmol/L — ABNORMAL HIGH (ref 3.5–5.2)
SODIUM: 140 mmol/L (ref 134–144)

## 2017-08-13 NOTE — Addendum Note (Signed)
Addended by: Meredith Leeds on: 08/13/2017 08:36 AM   Modules accepted: Orders

## 2019-04-28 ENCOUNTER — Encounter: Payer: Self-pay | Admitting: Family Medicine

## 2021-10-03 ENCOUNTER — Ambulatory Visit: Payer: Managed Care, Other (non HMO) | Admitting: Cardiology

## 2021-10-21 ENCOUNTER — Encounter: Payer: Self-pay | Admitting: Cardiology

## 2021-10-21 ENCOUNTER — Ambulatory Visit (INDEPENDENT_AMBULATORY_CARE_PROVIDER_SITE_OTHER): Payer: Managed Care, Other (non HMO) | Admitting: Cardiology

## 2021-10-21 VITALS — BP 128/80 | HR 75 | Ht 70.0 in | Wt 240.0 lb

## 2021-10-21 DIAGNOSIS — I319 Disease of pericardium, unspecified: Secondary | ICD-10-CM | POA: Diagnosis not present

## 2021-10-21 DIAGNOSIS — I471 Supraventricular tachycardia: Secondary | ICD-10-CM

## 2021-10-21 NOTE — Patient Instructions (Signed)
Medication Instructions:  NOT NEEDED *If you need a refill on your cardiac medications before your next appointment, please call your pharmacy*   Lab Work:  NO CHANGES     Testing/Procedures: NOT NEEDED   Follow-Up: At [facility], you and your health needs are our priority.  As part of our continuing mission to provide you with exceptional heart care, we have created designated Provider Care Teams.  These Care Teams include your primary Cardiologist (physician) and Advanced Practice Providers (APPs -  Physician Assistants and Nurse Practitioners) who all work together to provide you with the care you need, when you need it.  We recommend signing up for the patient portal called "MyChart".  Sign up information is provided on this After Visit Summary.  MyChart is used to connect with patients for Virtual Visits (Telemedicine).  Patients are able to view lab/test results, encounter notes, upcoming appointments, etc.  Non-urgent messages can be sent to your provider as well.   To learn more about what you can do with MyChart, go to [email or web address].    Your next appointment:   12 month(s)  The format for your next appointment:   In Person  Provider:   Edd Fabian, FNP    Then, Bryan Lemma, MD will plan to see you again in 12 month(s).      Important Information About Sugar

## 2021-10-21 NOTE — Progress Notes (Unsigned)
Primary Care Provider: Merlyn Albert, MD (Inactive) [facility] Cardiologist: Bryan Lemma, MD Electrophysiologist: None  Clinic Note: No chief complaint on file.   ===================================  ASSESSMENT/PLAN   Problem List Items Addressed This Visit   None   ===================================  HPI:    Gary Salazar is a 36 y.o. male who is being seen today for the evaluation of *** at the request of No ref. provider found.  Gary Salazar was last seen on July 07, 2019 Follow-up for history of PSVT.  He had actually previously been followed by Dr. Graciela Husbands.  Recent Hospitalizations: ***  Reviewed  CV studies:    The following studies were reviewed today: (if available, images/films reviewed: From Epic Chart or Care Everywhere) ***:   Interval History:   Gary Salazar   CV Review of Symptoms (Summary) Cardiovascular ROS: {roscv:310661}  REVIEWED OF SYSTEMS   ROS  I have reviewed and (if needed) personally updated the patient's problem list, medications, allergies, past medical and surgical history, social and family history.   PAST MEDICAL HISTORY   Past Medical History:  Diagnosis Date   PSVT (paroxysmal supraventricular tachycardia) (HCC)    Dr.Klein Dx'd ~2018, had been relatively stable in >  1 yr.    PAST SURGICAL HISTORY   Past Surgical History:  Procedure Laterality Date   APPENDECTOMY  08/2010    Immunization History  Administered Date(s) Administered   PFIZER(Purple Top)SARS-COV-2 Vaccination 10/30/2019, 11/20/2019    MEDICATIONS/ALLERGIES   Current Meds  Medication Sig   Multiple Vitamin (MULTIVITAMIN) tablet Take 1 tablet by mouth daily.   vitamin C (ASCORBIC ACID) 500 MG tablet Take 500 mg by mouth daily. Takes 3,000mg  per day    Allergies  Allergen Reactions   Azithromycin     SOCIAL HISTORY/FAMILY HISTORY   Reviewed in Epic:   Social History   Tobacco Use   Smoking status: Former   Smokeless  tobacco: Former    Quit date: 05/19/2006   Tobacco comments:    former light smoker  Substance Use Topics   Drug use: No   Social History   Social History Narrative   He is single. Lives alone. 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   He drinks maybe 6 beers a week. He does note that he quit smoking cigars. He does cardio and weights 3 days a week for anywhere from 60-80 minutes.   No family history on file.  OBJCTIVE -PE, EKG, labs   Wt Readings from Last 3 Encounters:  10/21/21 240 lb (108.9 kg)  08/02/17 249 lb (112.9 kg)  07/06/16 280 lb 3.2 oz (127.1 kg)    Physical Exam: BP 128/80   Pulse 75   Ht 5\' 10"  (1.778 m)   Wt 240 lb (108.9 kg)   SpO2 98%   BMI 34.44 kg/m  Physical Exam   Adult ECG Report  Rate: *** ;  Rhythm: {rhythm:17366};   Narrative Interpretation: ***  Recent Labs:  ***  Lab Results  Component Value Date   CHOL 241 (H) 08/02/2017   HDL 38 (L) 08/02/2017   LDLCALC 178 (H) 08/02/2017   TRIG 123 08/02/2017   CHOLHDL 6.3 (H) 08/02/2017   Lab Results  Component Value Date   CREATININE 1.05 08/02/2017   BUN 11 08/02/2017   NA 140 08/02/2017   K 5.4 (H) 08/02/2017   CL 101 08/02/2017   CO2 23 08/02/2017      Latest Ref Rng &  Units 08/17/2009    7:30 AM  CBC  WBC 4.0 - 10.5 K/uL 14.0   Hemoglobin 13.0 - 17.0 g/dL 60.6   Hematocrit 30.1 - 52.0 % 45.2   Platelets 150 - 400 K/uL 138     No results found for: "HGBA1C" No results found for: "TSH"  ================================================== I spent a total of *** minutes with the patient spent in direct patient consultation.  Additional time spent with chart review  / charting (studies, outside notes, etc): *** min Total Time: *** min  Current medicines are reviewed at length with the patient today.  (+/- concerns) ***  Notice: This dictation was prepared with Dragon dictation along with smart phrase technology. Any transcriptional errors that result from this process  are unintentional and may not be corrected upon review.   Studies Ordered:  No orders of the defined types were placed in this encounter.  No orders of the defined types were placed in this encounter.   Patient Instructions / Medication Changes & Studies & Tests Ordered   There are no Patient Instructions on file for this visit.    Bryan Lemma, M.D., M.S. Interventional Cardiologist   Pager # [phone] Phone # [phone] [address] [place], [place] [address]   Thank you for choosing [facility] at [facility]!!

## 2021-10-22 ENCOUNTER — Encounter: Payer: Self-pay | Admitting: Cardiology

## 2021-10-22 NOTE — Assessment & Plan Note (Signed)
,   I agree with him that his symptoms that happened back in June sounded very consistent with dermatitis most probably to his viral illness URI leading up to it thankfully, the chest discomfort is no longer present.  There is no rub on exam.  With no recurrent symptoms, I do not feel that we need to check an echocardiogram, however if he has more would check a 2D echo to exclude effusion.  Discussed using high-dose NSAIDs if symptoms recur.

## 2021-10-22 NOTE — Assessment & Plan Note (Addendum)
Thankfully, his SVT seems to be pretty well controlled he is not on any medications. Recommend continued attention to adequate hydration and avoiding triggers.  Reiterated the importance of vagal maneuvers.  Since his episodes are relatively few and far between, will follow for any Baseline therapy.   He would like to stay in touch with primary cardiologist.  As such we will have him follow-up every couple years.  -Follow-up in 12 months with Edd Fabian, FNP. We will alternate yearly visits between myself and [facility].
